# Patient Record
Sex: Male | Born: 1995 | Race: Black or African American | Hispanic: No | Marital: Single | Smoking: Never smoker
Health system: Southern US, Community
[De-identification: ages and names within clinical notes are randomized; demographics above are authoritative.]

## PROBLEM LIST (undated history)

## (undated) DIAGNOSIS — G809 Cerebral palsy, unspecified: Secondary | ICD-10-CM

## (undated) DIAGNOSIS — F79 Unspecified intellectual disabilities: Secondary | ICD-10-CM

## (undated) DIAGNOSIS — F39 Unspecified mood [affective] disorder: Secondary | ICD-10-CM

## (undated) DIAGNOSIS — H9190 Unspecified hearing loss, unspecified ear: Secondary | ICD-10-CM

## (undated) DIAGNOSIS — R9431 Abnormal electrocardiogram [ECG] [EKG]: Secondary | ICD-10-CM

## (undated) DIAGNOSIS — G9619 Other disorders of meninges, not elsewhere classified: Secondary | ICD-10-CM

## (undated) DIAGNOSIS — G96198 Other disorders of meninges, not elsewhere classified: Secondary | ICD-10-CM

## (undated) DIAGNOSIS — F909 Attention-deficit hyperactivity disorder, unspecified type: Secondary | ICD-10-CM

---

## 2016-05-23 ENCOUNTER — Encounter (HOSPITAL_COMMUNITY): Payer: Self-pay | Admitting: Emergency Medicine

## 2016-05-23 ENCOUNTER — Emergency Department (HOSPITAL_COMMUNITY)
Admission: EM | Admit: 2016-05-23 | Discharge: 2016-05-23 | Disposition: A | Discharge status: Home / Self Care | Payer: Medicaid Other | Attending: Emergency Medicine | Admitting: Emergency Medicine

## 2016-05-23 ENCOUNTER — Emergency Department (HOSPITAL_COMMUNITY): Payer: Medicaid Other

## 2016-05-23 DIAGNOSIS — Y999 Unspecified external cause status: Secondary | ICD-10-CM | POA: Insufficient documentation

## 2016-05-23 DIAGNOSIS — Y929 Unspecified place or not applicable: Secondary | ICD-10-CM | POA: Diagnosis not present

## 2016-05-23 DIAGNOSIS — Y939 Activity, unspecified: Secondary | ICD-10-CM | POA: Insufficient documentation

## 2016-05-23 DIAGNOSIS — Z79899 Other long term (current) drug therapy: Secondary | ICD-10-CM | POA: Insufficient documentation

## 2016-05-23 DIAGNOSIS — S0990XA Unspecified injury of head, initial encounter: Secondary | ICD-10-CM | POA: Diagnosis present

## 2016-05-23 HISTORY — DX: Unspecified hearing loss, unspecified ear: H91.90

## 2016-05-23 HISTORY — DX: Unspecified mood (affective) disorder: F39
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
## 2016-05-23 NOTE — Discharge Instructions (Signed)
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
## 2017-04-01 ENCOUNTER — Ambulatory Visit: Payer: Medicaid Other | Admitting: Occupational Therapy

## 2017-04-01 ENCOUNTER — Ambulatory Visit: Payer: Medicaid Other | Attending: Internal Medicine | Admitting: Physical Therapy

## 2017-04-01 DIAGNOSIS — R278 Other lack of coordination: Secondary | ICD-10-CM

## 2017-04-01 DIAGNOSIS — R2689 Other abnormalities of gait and mobility: Secondary | ICD-10-CM | POA: Diagnosis present

## 2017-04-01 NOTE — Therapy (Signed)
[facility] [address] [place], [place], [address] Phone: [phone]   Fax:  [phone]  Occupational Therapy Evaluation  Patient Details  Name: Jeremy Olsen MRN: [id number] Date of Birth: Oct 26, 1995 Referring Provider: Theresa Duty, FNP  Encounter Date: 04/01/2017      OT End of Session - 04/01/17 0915    Visit Number 1   Number of Visits 1   Date for OT Re-Evaluation --  eval only   Authorization Type Medicaid   OT Start Time 0847   OT Stop Time 0904   OT Time Calculation (min) 17 min   Activity Tolerance Patient tolerated treatment well   Behavior During Therapy [facility] for tasks assessed/performed      Past Medical History:  Diagnosis Date  . Deaf   . Mood disorder (HCC)     No past surgical history on file.  There were no vitals filed for this visit.      Subjective Assessment - 04/01/17 0848    Subjective  Pt reports that he is happy and doing well at group home.  Caregiver reports no concerns with participation in activities at group home or ADLs   Patient is accompained by: Interpreter  caregiver from group home   Pertinent History intellectual functional disabilities, ADHD, CP, deafness, arachnoid cyst spine, mood disorder   Limitations intellectual disabilities   Currently in Pain? Yes   Pain Score --  unable to rate   Pain Location Knee   Pain Orientation Left   Pain Descriptors / Indicators --  unable to describe   Pain Onset --  unknown, caregiver reports that pt played basketball/swam yesterday with no c/o pain   Aggravating Factors  initially said at night only, then said walking aggravates   Pain Relieving Factors unknown           [facility] OT Assessment - 04/01/17 0849      Assessment   Diagnosis other intellectual disabilities (F78)   Referring Provider Theresa Duty, FNP   Onset Date 03/18/17  date of referral     Precautions   Precautions --  supervision for decr safety  awareness     Balance Screen   Has the patient fallen in the past 6 months No     Home  Environment   Family/patient expects to be discharged to: Group home   Lives With --  lives in group home     Prior Function   Level of Independence --  supervision for safety due to cognitive defcits   Leisure plays basketball, football, bowling, swimming, watches movies     ADL   ADL comments BADLs supervision-mod I per pt/caregiver report;  supervision for safety for selected IADLs below and assist for higher-level IADLs due to cognitive deficits (financial management, transportation, etc).     IADL   Light Housekeeping Does personal laundry completely  cleans room, dusts, cleans bathroom   Meal Prep --  cooks 1 day/week     Written Expression   Dominant Hand Right     Vision - History   Additional Comments pt reports that he wears for movies/tv     Cognition   Overall Cognitive Status History of cognitive impairments - at baseline  hx of intellectual disabilities   Attention Selective  able to answer simple questions/follow simple directions     Coordination   Other able to tie shoes, appears WNL   Coordination Coordination grossly WFL for tasks assessed (but mildly incr time, with  processing speed likely also affecting).  Pt able to oppose to each finger and coordination sufficient for ADLs/IADLs, leisure tasks.     ROM / Strength   AROM / PROM / Strength AROM;Strength     AROM   Overall AROM  Within functional limits for tasks performed   Overall AROM Comments but tends to keep bilateral elbows slightly bent with reach     Strength   Overall Strength Within functional limits for tasks performed                                     Plan - 04/01/17 0916    Clinical Impression Statement Pt presents today with intellectual disabilities; however, coordination, strength, vision, ROM appear WFL for participation in ADLs/current IADLs and leisure  activities.  Pt is deaf but was able to participate in evaluation with interpreter.   No further occupational therapy is needed at this time.   Occupational Profile and client history currently impacting functional performance Pt is a 21 y.o. male with hx of intellectual disabilities who is here for annual group home OT evaluation.  Pt with PMH that includes:  mood disorder, ADHD, CP, deafness, arachnoid cyst spine, intellectual functional disability.  Per pt/caregiver, pt is performing BADLs/selected IADLs within group home setting with supervision for safety due to cognitive deficits.  Pt has lived at group home for approx 6 montths and appears to be doing well with routine and is able to participate in leisure activities.  Pt/caregiver deny any recent medical or functional changes affecting ADLs/IADLs.   OT Frequency --  eval only   Plan eval only   Clinical Decision Making Limited treatment options, no task modification necessary   Recommended Other Services none   Consulted and Agree with Plan of Care Patient;Family member/caregiver   Family Member Consulted caregiver from group home      Patient will benefit from skilled therapeutic intervention in order to improve the following deficits and impairments:     Visit Diagnosis: Other lack of coordination    Problem List There are no active problems to display for this patient.   [facility] LLCFREEMAN,ANGELA 04/01/2017, 2:38 PM  [facility] [address] [place], [place], [address] Phone: [phone]   Fax:  [phone]  Name: Jeremy Olsen MRN: [id number] Date of Birth: March 04, 1996   Willa FraterAngela Freeman, OTR/L [facility] [address] [place], [place]  [address] [phone] phone [phone] 04/01/17 2:38 PM
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
## 2018-01-29 ENCOUNTER — Emergency Department (HOSPITAL_COMMUNITY)
Admission: EM | Admit: 2018-01-29 | Discharge: 2018-01-29 | Disposition: A | Discharge status: Home / Self Care | Payer: Medicaid Other | Attending: Emergency Medicine | Admitting: Emergency Medicine

## 2018-01-29 ENCOUNTER — Other Ambulatory Visit: Payer: Self-pay

## 2018-01-29 ENCOUNTER — Encounter (HOSPITAL_COMMUNITY): Payer: Self-pay | Admitting: Emergency Medicine

## 2018-01-29 DIAGNOSIS — H913 Deaf nonspeaking, not elsewhere classified: Secondary | ICD-10-CM | POA: Diagnosis not present

## 2018-01-29 DIAGNOSIS — Z79899 Other long term (current) drug therapy: Secondary | ICD-10-CM | POA: Diagnosis not present

## 2018-01-29 DIAGNOSIS — R4689 Other symptoms and signs involving appearance and behavior: Secondary | ICD-10-CM

## 2018-01-29 LAB — URINALYSIS, ROUTINE W REFLEX MICROSCOPIC
BACTERIA UA: NONE SEEN
Bilirubin Urine: NEGATIVE
Glucose, UA: NEGATIVE mg/dL
Hgb urine dipstick: NEGATIVE
KETONES UR: NEGATIVE mg/dL
Leukocytes, UA: NEGATIVE
Nitrite: NEGATIVE
PH: 6 (ref 5.0–8.0)
PROTEIN: 30 mg/dL — AB
Specific Gravity, Urine: 1.02 (ref 1.005–1.030)

## 2018-01-29 LAB — RAPID URINE DRUG SCREEN, HOSP PERFORMED
Amphetamines: POSITIVE — AB
BENZODIAZEPINES: NOT DETECTED
Barbiturates: NOT DETECTED
COCAINE: NOT DETECTED
Opiates: NOT DETECTED
Tetrahydrocannabinol: NOT DETECTED

## 2018-01-29 MED ORDER — TRAZODONE HCL 50 MG PO TABS
100.0000 mg | ORAL_TABLET | Freq: Every day | ORAL | Status: DC
  Administered 2018-01-29: 100 mg via ORAL
  Filled 2018-01-29: qty 2
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
## 2018-01-29 NOTE — Discharge Instructions (Addendum)
Return if problems
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
## 2018-02-12 ENCOUNTER — Emergency Department (HOSPITAL_COMMUNITY)
Admission: EM | Admit: 2018-02-12 | Discharge: 2018-02-12 | Disposition: A | Discharge status: Home / Self Care | Payer: Medicaid Other | Attending: Emergency Medicine | Admitting: Emergency Medicine

## 2018-02-12 ENCOUNTER — Encounter (HOSPITAL_COMMUNITY): Payer: Self-pay | Admitting: Emergency Medicine

## 2018-02-12 ENCOUNTER — Other Ambulatory Visit: Payer: Self-pay

## 2018-02-12 ENCOUNTER — Emergency Department (HOSPITAL_COMMUNITY): Payer: Medicaid Other

## 2018-02-12 DIAGNOSIS — R4689 Other symptoms and signs involving appearance and behavior: Secondary | ICD-10-CM | POA: Diagnosis not present

## 2018-02-12 DIAGNOSIS — M25562 Pain in left knee: Secondary | ICD-10-CM | POA: Diagnosis not present

## 2018-02-12 DIAGNOSIS — Z79899 Other long term (current) drug therapy: Secondary | ICD-10-CM | POA: Insufficient documentation

## 2018-02-12 DIAGNOSIS — G809 Cerebral palsy, unspecified: Secondary | ICD-10-CM | POA: Diagnosis not present

## 2018-02-12 HISTORY — DX: Abnormal electrocardiogram (ECG) (EKG): R94.31

## 2018-02-12 HISTORY — DX: Unspecified intellectual disabilities: F79

## 2018-02-12 HISTORY — DX: Other disorders of meninges, not elsewhere classified: G96.198

## 2018-02-12 HISTORY — DX: Cerebral palsy, unspecified: G80.9

## 2018-02-12 HISTORY — DX: Other disorders of meninges, not elsewhere classified: G96.19

## 2018-02-12 HISTORY — DX: Attention-deficit hyperactivity disorder, unspecified type: F90.9
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
## 2018-02-12 NOTE — Discharge Instructions (Signed)
You were seen here today for aggressive behavior. If you have thoughts of harming yourself or other please return for psychiatric evaluation. If you develop worsening or new concerning symptoms you can return to the emergency department for re-evaluation.
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
## 2020-01-16 ENCOUNTER — Encounter (HOSPITAL_COMMUNITY): Payer: Self-pay

## 2020-01-16 ENCOUNTER — Emergency Department (HOSPITAL_COMMUNITY)
Admission: EM | Admit: 2020-01-16 | Discharge: 2020-01-17 | Disposition: A | Discharge status: Home / Self Care | Payer: Medicaid Other | Attending: Emergency Medicine | Admitting: Emergency Medicine

## 2020-01-16 ENCOUNTER — Other Ambulatory Visit: Payer: Self-pay

## 2020-01-16 DIAGNOSIS — F79 Unspecified intellectual disabilities: Secondary | ICD-10-CM | POA: Insufficient documentation

## 2020-01-16 DIAGNOSIS — T7421XA Adult sexual abuse, confirmed, initial encounter: Secondary | ICD-10-CM

## 2020-01-16 DIAGNOSIS — G809 Cerebral palsy, unspecified: Secondary | ICD-10-CM | POA: Diagnosis not present

## 2020-01-16 DIAGNOSIS — T7621XA Adult sexual abuse, suspected, initial encounter: Secondary | ICD-10-CM | POA: Insufficient documentation

## 2020-01-16 DIAGNOSIS — H913 Deaf nonspeaking, not elsewhere classified: Secondary | ICD-10-CM | POA: Diagnosis not present
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
## 2020-01-16 NOTE — ED Notes (Signed)
Sane nurse called and given update and pt history. Will come to assess pt.
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
